# Patient Record
Sex: Female | Born: 1950 | Race: White | Hispanic: No | Marital: Married | Smoking: Never smoker
Health system: Southern US, Community
[De-identification: ages and names within clinical notes are randomized; demographics above are authoritative.]

## PROBLEM LIST (undated history)

## (undated) DIAGNOSIS — J45909 Unspecified asthma, uncomplicated: Secondary | ICD-10-CM

---

## 2020-03-21 ENCOUNTER — Other Ambulatory Visit: Payer: Self-pay | Admitting: Family Medicine

## 2020-03-21 DIAGNOSIS — E2839 Other primary ovarian failure: Secondary | ICD-10-CM

## 2021-11-03 ENCOUNTER — Observation Stay: Payer: Medicare Other

## 2021-11-03 ENCOUNTER — Other Ambulatory Visit: Payer: Self-pay

## 2021-11-03 ENCOUNTER — Emergency Department: Payer: Medicare Other

## 2021-11-03 ENCOUNTER — Encounter: Payer: Self-pay | Admitting: Family Medicine

## 2021-11-03 ENCOUNTER — Observation Stay
Admission: EM | Admit: 2021-11-03 | Discharge: 2021-11-04 | Disposition: A | Discharge status: Home / Self Care | Payer: Medicare Other | Attending: Internal Medicine | Admitting: Internal Medicine

## 2021-11-03 DIAGNOSIS — E039 Hypothyroidism, unspecified: Secondary | ICD-10-CM | POA: Diagnosis not present

## 2021-11-03 DIAGNOSIS — G4733 Obstructive sleep apnea (adult) (pediatric): Secondary | ICD-10-CM

## 2021-11-03 DIAGNOSIS — Z20822 Contact with and (suspected) exposure to covid-19: Secondary | ICD-10-CM | POA: Insufficient documentation

## 2021-11-03 DIAGNOSIS — Z79899 Other long term (current) drug therapy: Secondary | ICD-10-CM | POA: Insufficient documentation

## 2021-11-03 DIAGNOSIS — Z7901 Long term (current) use of anticoagulants: Secondary | ICD-10-CM | POA: Insufficient documentation

## 2021-11-03 DIAGNOSIS — Z7984 Long term (current) use of oral hypoglycemic drugs: Secondary | ICD-10-CM | POA: Diagnosis not present

## 2021-11-03 DIAGNOSIS — Z9989 Dependence on other enabling machines and devices: Secondary | ICD-10-CM

## 2021-11-03 DIAGNOSIS — I482 Chronic atrial fibrillation, unspecified: Secondary | ICD-10-CM | POA: Diagnosis present

## 2021-11-03 DIAGNOSIS — J45909 Unspecified asthma, uncomplicated: Secondary | ICD-10-CM | POA: Diagnosis not present

## 2021-11-03 DIAGNOSIS — E119 Type 2 diabetes mellitus without complications: Secondary | ICD-10-CM | POA: Diagnosis not present

## 2021-11-03 DIAGNOSIS — R29810 Facial weakness: Principal | ICD-10-CM | POA: Diagnosis present

## 2021-11-03 DIAGNOSIS — I10 Essential (primary) hypertension: Secondary | ICD-10-CM | POA: Diagnosis not present

## 2021-11-03 DIAGNOSIS — J4540 Moderate persistent asthma, uncomplicated: Secondary | ICD-10-CM

## 2021-11-03 DIAGNOSIS — R918 Other nonspecific abnormal finding of lung field: Secondary | ICD-10-CM | POA: Diagnosis present

## 2021-11-03 DIAGNOSIS — G510 Bell's palsy: Secondary | ICD-10-CM

## 2021-11-03 HISTORY — DX: Obstructive sleep apnea (adult) (pediatric): G47.33

## 2021-11-03 HISTORY — DX: Hypothyroidism, unspecified: E03.9

## 2021-11-03 HISTORY — DX: Unspecified asthma, uncomplicated: J45.909

## 2021-11-03 HISTORY — DX: Chronic atrial fibrillation, unspecified: I48.20

## 2021-11-03 LAB — COMPREHENSIVE METABOLIC PANEL
ALT: 31 U/L (ref 0–44)
AST: 25 U/L (ref 15–41)
Albumin: 4 g/dL (ref 3.5–5.0)
Alkaline Phosphatase: 54 U/L (ref 38–126)
Anion gap: 8 (ref 5–15)
BUN: 15 mg/dL (ref 8–23)
CO2: 27 mmol/L (ref 22–32)
Calcium: 9.6 mg/dL (ref 8.9–10.3)
Chloride: 99 mmol/L (ref 98–111)
Creatinine, Ser: 1.02 mg/dL — ABNORMAL HIGH (ref 0.44–1.00)
GFR, Estimated: 59 mL/min — ABNORMAL LOW (ref >60)
Glucose, Bld: 142 mg/dL — ABNORMAL HIGH (ref 70–99)
Potassium: 4.4 mmol/L (ref 3.5–5.1)
Sodium: 134 mmol/L — ABNORMAL LOW (ref 135–145)
Total Bilirubin: 0.8 mg/dL (ref 0.3–1.2)
Total Protein: 7.1 g/dL (ref 6.5–8.1)

## 2021-11-03 LAB — CBC
HCT: 40.6 % (ref 36.0–46.0)
Hemoglobin: 14.1 g/dL (ref 12.0–15.0)
MCH: 31.7 pg (ref 26.0–34.0)
MCHC: 34.7 g/dL (ref 30.0–36.0)
MCV: 91.2 fL (ref 80.0–100.0)
Platelets: 198 10*3/uL (ref 150–400)
RBC: 4.45 MIL/uL (ref 3.87–5.11)
RDW: 12.7 % (ref 11.5–15.5)
WBC: 7 10*3/uL (ref 4.0–10.5)
nRBC: 0 % (ref 0.0–0.2)

## 2021-11-03 LAB — DIFFERENTIAL
Abs Immature Granulocytes: 0.05 10*3/uL (ref 0.00–0.07)
Basophils Absolute: 0.1 10*3/uL (ref 0.0–0.1)
Basophils Relative: 1 %
Eosinophils Absolute: 0.1 10*3/uL (ref 0.0–0.5)
Eosinophils Relative: 1 %
Immature Granulocytes: 1 %
Lymphocytes Relative: 32 %
Lymphs Abs: 2.3 10*3/uL (ref 0.7–4.0)
Monocytes Absolute: 0.8 10*3/uL (ref 0.1–1.0)
Monocytes Relative: 12 %
Neutro Abs: 3.7 10*3/uL (ref 1.7–7.7)
Neutrophils Relative %: 53 %

## 2021-11-03 LAB — PROTIME-INR
INR: 1.2 (ref 0.8–1.2)
Prothrombin Time: 15.5 seconds — ABNORMAL HIGH (ref 11.4–15.2)

## 2021-11-03 LAB — RESP PANEL BY RT-PCR (FLU A&B, COVID) ARPGX2
Influenza A by PCR: NEGATIVE
Influenza B by PCR: NEGATIVE
SARS Coronavirus 2 by RT PCR: NEGATIVE

## 2021-11-03 LAB — APTT: aPTT: 28 seconds (ref 24–36)

## 2021-11-03 MED ORDER — APIXABAN 5 MG PO TABS
5.0000 mg | ORAL_TABLET | Freq: Two times a day (BID) | ORAL | Status: DC
  Administered 2021-11-04: 5 mg via ORAL
  Filled 2021-11-03: qty 1

## 2021-11-03 MED ORDER — SODIUM CHLORIDE 0.9 % IV SOLN: INTRAVENOUS | Status: DC

## 2021-11-03 MED ORDER — ACETAMINOPHEN 325 MG PO TABS: 650.0000 mg | ORAL_TABLET | ORAL | Status: DC | PRN

## 2021-11-03 MED ORDER — LEVOTHYROXINE SODIUM 50 MCG PO TABS
75.0000 ug | ORAL_TABLET | Freq: Every day | ORAL | Status: DC
  Administered 2021-11-04: 75 ug via ORAL
  Filled 2021-11-03: qty 2

## 2021-11-03 MED ORDER — SPIRONOLACTONE 25 MG PO TABS
100.0000 mg | ORAL_TABLET | Freq: Every day | ORAL | Status: DC
  Administered 2021-11-04: 100 mg via ORAL
  Filled 2021-11-03: qty 4

## 2021-11-03 MED ORDER — ACETAMINOPHEN 325 MG RE SUPP: 650.0000 mg | RECTAL | Status: DC | PRN

## 2021-11-03 MED ORDER — ACETAMINOPHEN 160 MG/5ML PO SOLN
650.0000 mg | ORAL | Status: DC | PRN
  Filled 2021-11-03: qty 20.3

## 2021-11-03 MED ORDER — ALBUTEROL SULFATE (2.5 MG/3ML) 0.083% IN NEBU: 2.5000 mg | INHALATION_SOLUTION | Freq: Four times a day (QID) | RESPIRATORY_TRACT | Status: DC | PRN

## 2021-11-03 MED ORDER — ALPRAZOLAM 0.5 MG PO TABS: 0.5000 mg | ORAL_TABLET | Freq: Every day | ORAL | Status: DC | PRN

## 2021-11-03 MED ORDER — INSULIN ASPART 100 UNIT/ML IJ SOLN: 0.0000 [IU] | Freq: Three times a day (TID) | INTRAMUSCULAR | Status: DC

## 2021-11-03 MED ORDER — LORAZEPAM 2 MG/ML IJ SOLN
1.0000 mg | Freq: Once | INTRAMUSCULAR | Status: AC | PRN
  Administered 2021-11-03: 2 mg via INTRAVENOUS
  Filled 2021-11-03: qty 1

## 2021-11-03 MED ORDER — IOHEXOL 350 MG/ML SOLN
75.0000 mL | Freq: Once | INTRAVENOUS | Status: AC | PRN
  Administered 2021-11-03: 75 mL via INTRAVENOUS

## 2021-11-03 MED ORDER — MIRTAZAPINE 15 MG PO TABS: 30.0000 mg | ORAL_TABLET | Freq: Every day | ORAL | Status: DC

## 2021-11-03 MED ORDER — MONTELUKAST SODIUM 10 MG PO TABS: 10.0000 mg | ORAL_TABLET | Freq: Every day | ORAL | Status: DC

## 2021-11-03 MED ORDER — STROKE: EARLY STAGES OF RECOVERY BOOK: Freq: Once | Status: DC

## 2021-11-03 MED ORDER — PRAVASTATIN SODIUM 20 MG PO TABS: 20.0000 mg | ORAL_TABLET | Freq: Every day | ORAL | Status: DC

## 2021-11-03 MED ORDER — SODIUM CHLORIDE 0.9% FLUSH
3.0000 mL | Freq: Once | INTRAVENOUS | Status: AC
  Administered 2021-11-03: 3 mL via INTRAVENOUS

## 2021-11-03 MED ORDER — MOMETASONE FURO-FORMOTEROL FUM 100-5 MCG/ACT IN AERO
2.0000 | INHALATION_SPRAY | Freq: Two times a day (BID) | RESPIRATORY_TRACT | Status: DC
Start: 2021-11-03 — End: 2021-11-04

## 2021-11-03 MED ORDER — SENNOSIDES-DOCUSATE SODIUM 8.6-50 MG PO TABS: 1.0000 | ORAL_TABLET | Freq: Every evening | ORAL | Status: DC | PRN

## 2021-11-03 NOTE — ED Provider Notes (Signed)
? ?[facility] ?Provider Note ? ? ? Event Date/Time  ? First MD Initiated Contact with Patient 11/03/21 1842   ?  (approximate) ? ? ?History  ? ?No chief complaint on file. ? ? ?HPI ? ?Whitney Bailey is a 71 y.o. female with A-fib on Eliquis who comes in with sudden onset of left facial droop.  Patient reports a little bit of watering in her right eye earlier today but reports waking up and feeling her normal self and then around 530-545 family noted that she had some droop on her left side.  Husband was with her earlier around 23 does not remember seeing anything.  She reports having normal speech no weakness in her arms or legs.  She is able to wrinkle her bilateral forehead.  Denies any issues with her speech.  Stroke code called from EMS.  Patient does report being compliant with her Eliquis with last dose this morning ? ?Physical Exam  ? ?Triage Vital Signs: ?ED Triage Vitals  ?Blood pressure (!) 150/73, pulse (!) 54, temperature 98.4 ?F (36.9 ?C), resp. rate 18, weight 78 kg, SpO2 99 %. ? ?Most recent vital signs: ?Vitals:  ? 11/03/21 1930 11/03/21 2000  ?BP: (!) 163/72 (!) 150/73  ?Pulse: (!) 59 (!) 54  ?Resp: 12 18  ?Temp:    ?SpO2: 100% 99%  ? ? ? ?General: Awake, no distress.  ?CV:  Good peripheral perfusion.  ?Resp:  Normal effort.  ?Abd:  No distention.  ?Other:  NIHSS 2 -is stable to wrinkle the left forehead but has facial droop on the left side.  Otherwise neuro exam is normal with equal strength in arms and legs. ? ? ?ED Results / Procedures / Treatments  ? ?Labs ?(all labs ordered are listed, but only abnormal results are displayed) ?Labs Reviewed  ?PROTIME-INR  ?APTT  ?CBC  ?DIFFERENTIAL  ?COMPREHENSIVE METABOLIC PANEL  ?CBG MONITORING, ED  ? ? ? ?EKG ? ?My interpretation of EKG: ? ?Looks like atrial flutter with a rate of 64 without any ST elevation or T wave inversions. ? ?RADIOLOGY ?I have reviewed the CT head negative ? ?PROCEDURES: ? ?Critical Care performed: No ? ?.1-3  Lead EKG Interpretation ?Performed by: Concha Se, MD ?Authorized by: Concha Se, MD  ? ?  Interpretation: abnormal   ?  ECG rate:  60 ?  ECG rate assessment: normal   ?  Rhythm: atrial flutter   ?  Ectopy: none   ?  Conduction: normal   ? ? ?MEDICATIONS ORDERED IN ED: ?Medications  ?sodium chloride flush (NS) 0.9 % injection 3 mL (has no administration in time range)  ? ? ? ?IMPRESSION / MDM / ASSESSMENT AND PLAN / ED COURSE  ?I reviewed the triage vital signs and the nursing notes. ? ?Patient with known atrial fibrillation who comes in with concerns for left-sided facial droop.  This concerning for hemorrhagic stroke versus ischemic stroke versus Bell's palsy.  Stroke code called with EMS ? ?Discussed the case with Dr. Selina Cooley who canceled the code stroke given patient is not a tPA candidate and no evidence of LVO with only some left-sided facial droop.  Given patient's age she recommends admission to the hospital for full stroke work-up. ? ?CMP shows slightly low sodium ?CBC normal ?INR normal ? ?The patient is on the cardiac monitor to evaluate for evidence of arrhythmia and/or significant heart rate changes. ? ?FINAL CLINICAL IMPRESSION(S) / ED DIAGNOSES  ? ?Final diagnoses:  ?Facial droop  ? ? ? ?  Rx / DC Orders  ? ?ED Discharge Orders   ? ? None  ? ?  ? ? ? ?Note:  This document was prepared using Dragon voice recognition software and may include unintentional dictation errors. ?  ?Concha Se, MD ?11/03/21 2017 ? ?

## 2021-11-03 NOTE — H&P (Signed)
History and Physical    Whitney Bailey NID:[id number] DOB: 16-Jun-1951 DOA: 11/03/2021  PCP: Ethelda Chick, MD   Patient coming from: Home   Chief Complaint: Left facial weakness   HPI: Whitney Bailey is a pleasant 71 y.o. female with medical history significant for asthma, atrial fibrillation on Eliquis, OSA on CPAP, hypertension, type 2 diabetes mellitus, and hypothyroidism, now presenting to the emergency department with left facial weakness.  The patient reports that she was experiencing some soreness near the angle of her left mandible last night but had otherwise been in her usual state of health until shortly after 5 PM this evening when family noted that her left face did not appear to be moving.  She had been speaking with a family member who had not noticed this a few minutes earlier.  She denies any headache, change in vision or hearing, or any other weakness or numbness.  She does not miss any doses of Eliquis.  ED Course: Upon arrival to the ED, patient is found to be afebrile and saturating well on room air with stable blood pressure.  EKG features atrial fibrillation and head CT negative for acute intracranial abnormality.  Chemistry panel with sodium 134 and creatinine 1.02.  Neurology was consulted by the ED physician.  Review of Systems:  All other systems reviewed and apart from HPI, are negative.  Past Medical History:  Diagnosis Date   Asthma    Atrial fibrillation, chronic (HCC) 11/03/2021   Hypothyroidism 11/03/2021   OSA on CPAP 11/03/2021    History reviewed. No pertinent surgical history.  Social History:   reports that she has never smoked. She has never used smokeless tobacco. She reports that she does not currently use alcohol. No history on file for drug use.  Allergies  Allergen Reactions   Hydrochlorothiazide Other (See Comments)    Hyponatremia    History reviewed. No pertinent family history.   Prior to Admission medications   Medication Sig  Start Date End Date Taking? Authorizing Provider  ADVAIR HFA [id number] MCG/ACT inhaler Inhale 2 puffs into the lungs every 12 (twelve) hours. 09/30/21  Yes [provider]  albuterol (VENTOLIN HFA) 108 (90 Base) MCG/ACT inhaler Inhale 2 puffs into the lungs every 6 (six) hours as needed for wheezing or shortness of breath. 09/30/21  Yes [provider]  Cholecalciferol 50 MCG (2000 UT) CAPS Take 2,000 Units by mouth in the morning.   Yes [provider]  cyanocobalamin 1000 MCG tablet Take 1,000 mcg by mouth in the morning.   Yes [provider]  ELIQUIS 5 MG TABS tablet Take 5 mg by mouth 2 (two) times daily. 09/28/21  Yes [provider]  metFORMIN (GLUCOPHAGE-XR) 500 MG 24 hr tablet Take 500 mg by mouth 2 (two) times daily. 09/28/21  Yes [provider]  mirtazapine (REMERON) 30 MG tablet Take 30 mg by mouth at bedtime. 10/19/21  Yes [provider]  mometasone (NASONEX) 50 MCG/ACT nasal spray Place 1 spray into the nose 2 (two) times daily. 09/01/21  Yes [provider]  montelukast (SINGULAIR) 10 MG tablet Take 10 mg by mouth at bedtime. 10/25/21  Yes [provider]  olmesartan (BENICAR) 40 MG tablet Take 40 mg by mouth daily. 09/25/21  Yes [provider]  pravastatin (PRAVACHOL) 20 MG tablet Take 20 mg by mouth at bedtime. 10/25/21  Yes [provider]  spironolactone (ALDACTONE) 100 MG tablet Take 100 mg by mouth daily. 08/18/21  Yes [provider]  SYNTHROID 75 MCG tablet Take 75 mcg by mouth daily. 10/04/21  Yes [provider]  ALPRAZolam Prudy Feeler) 0.5 MG tablet Take 0.5 mg by mouth daily as needed for anxiety. 09/27/21   [provider]  scopolamine (TRANSDERM-SCOP) 1 MG/3DAYS Place 1 patch onto the skin every 3 (three) days. 10/23/21   [provider]    Physical Exam: Vitals:   11/03/21 1900 11/03/21 1907 11/03/21 1930 11/03/21 2000  BP: (!) 170/77  (!) 163/72 (!) 150/73   Pulse: 62  (!) 59 (!) 54  Resp: Temp: 98.4 F (36.9 C)     SpO2: 100%  100% 99%  Weight:  78 kg      Constitutional: NAD, calm  Eyes: PERTLA, lids and conjunctivae normal ENMT: Mucous membranes are moist. Posterior pharynx clear of any exudate or lesions.   Neck: supple, no masses  Respiratory:  no wheezing, no crackles. No accessory muscle use.  Cardiovascular: S1 & S2 heard, regular rate and rhythm. No extremity edema.   Abdomen: No distension, no tenderness, soft. Bowel sounds active.  Musculoskeletal: no clubbing / cyanosis. No joint deformity upper and lower extremities.   Skin: no significant rashes, lesions, ulcers. Warm, dry, well-perfused. Neurologic: Left face weakness, CN 2-12 grossly intact otherwise. Sensation to light touch intact. Strength 5/5 in all 4 limbs. Alert and oriented.  Psychiatric: Very pleasant. Cooperative.    Labs and Imaging on Admission: I have personally reviewed following labs and imaging studies  CBC: Recent Labs  Lab 11/03/21 1918  WBC 7.0  NEUTROABS 3.7  HGB 14.1  HCT 40.6  MCV 91.2  PLT 198   Basic Metabolic Panel: Recent Labs  Lab 11/03/21 1918  NA 134*  K 4.4  CL 99  CO2 27  GLUCOSE 142*  BUN 15  CREATININE 1.02*  CALCIUM 9.6   GFR: CrCl cannot be calculated (Unknown ideal weight.). Liver Function Tests: Recent Labs  Lab 11/03/21 1918  AST 25  ALT 31  ALKPHOS 54  BILITOT 0.8  PROT 7.1  ALBUMIN 4.0   No results for input(s): LIPASE, AMYLASE in the last 168 hours. No results for input(s): AMMONIA in the last 168 hours. Coagulation Profile: Recent Labs  Lab 11/03/21 1918  INR 1.2   Cardiac Enzymes: No results for input(s): CKTOTAL, CKMB, CKMBINDEX, TROPONINI in the last 168 hours. BNP (last 3 results) No results for input(s): PROBNP in the last 8760 hours. HbA1C: No results for input(s): HGBA1C in the last 72 hours. CBG: No results for input(s): GLUCAP in the last 168 hours. Lipid Profile: No  results for input(s): CHOL, HDL, LDLCALC, TRIG, CHOLHDL, LDLDIRECT in the last 72 hours. Thyroid Function Tests: No results for input(s): TSH, T4TOTAL, FREET4, T3FREE, THYROIDAB in the last 72 hours. Anemia Panel: No results for input(s): VITAMINB12, FOLATE, FERRITIN, TIBC, IRON, RETICCTPCT in the last 72 hours. Urine analysis: No results found for: COLORURINE, APPEARANCEUR, LABSPEC, PHURINE, GLUCOSEU, HGBUR, BILIRUBINUR, KETONESUR, PROTEINUR, UROBILINOGEN, NITRITE, LEUKOCYTESUR Sepsis Labs: (procalcitonin:4,lacticidven:4) ) Recent Results (from the past 240 hour(s))  Resp Panel by RT-PCR (Flu A&B, Covid) Nasopharyngeal Swab     Status: None   Collection Time: 11/03/21  9:10 PM   Specimen: Nasopharyngeal Swab; Nasopharyngeal(NP) swabs in vial transport medium  Result Value Ref Range Status   SARS Coronavirus 2 by RT PCR NEGATIVE NEGATIVE Final    Comment: (NOTE) SARS-CoV-2 target nucleic acids are NOT DETECTED.  The SARS-CoV-2 RNA is generally detectable in upper respiratory specimens  during the acute phase of infection. The lowest concentration of SARS-CoV-2 viral copies this assay can detect is 138 copies/mL. A negative result does not preclude SARS-Cov-2 infection and should not be used as the sole basis for treatment or other patient management decisions. A negative result may occur with  improper specimen collection/handling, submission of specimen other than nasopharyngeal swab, presence of viral mutation(s) within the areas targeted by this assay, and inadequate number of viral copies(<138 copies/mL). A negative result must be combined with clinical observations, patient history, and epidemiological information. The expected result is Negative.  Fact Sheet for Patients:  BloggerCourse.com  Fact Sheet for Healthcare Providers:  SeriousBroker.it  This test is no t yet approved or cleared by the [place] FDA and   has been authorized for detection and/or diagnosis of SARS-CoV-2 by FDA under an Emergency Use Authorization (EUA). This EUA will remain  in effect (meaning this test can be used) for the duration of the COVID-19 declaration under Section 564(b)(1) of the Act, 21 U.S.C.section 360bbb-3(b)(1), unless the authorization is terminated  or revoked sooner.       Influenza A by PCR NEGATIVE NEGATIVE Final   Influenza B by PCR NEGATIVE NEGATIVE Final    Comment: (NOTE) The Xpert Xpress SARS-CoV-2/FLU/RSV plus assay is intended as an aid in the diagnosis of influenza from Nasopharyngeal swab specimens and should not be used as a sole basis for treatment. Nasal washings and aspirates are unacceptable for Xpert Xpress SARS-CoV-2/FLU/RSV testing.  Fact Sheet for Patients: BloggerCourse.com  Fact Sheet for Healthcare Providers: SeriousBroker.it  This test is not yet approved or cleared by the [place] FDA and has been authorized for detection and/or diagnosis of SARS-CoV-2 by FDA under an Emergency Use Authorization (EUA). This EUA will remain in effect (meaning this test can be used) for the duration of the COVID-19 declaration under Section 564(b)(1) of the Act, 21 U.S.C. section 360bbb-3(b)(1), unless the authorization is terminated or revoked.  Performed at [facility], [address], [place], [place] [address]      Radiological Exams on Admission: CT HEAD CODE STROKE WO CONTRAST  Result Date: 11/03/2021 CLINICAL DATA:  Code stroke.  Acute neurologic deficit EXAM: CT HEAD WITHOUT CONTRAST TECHNIQUE: Contiguous axial images were obtained from the base of the skull through the vertex without intravenous contrast. RADIATION DOSE REDUCTION: This exam was performed according to the departmental dose-optimization program which includes automated exposure control, adjustment of the mA and/or kV according to patient size  and/or use of iterative reconstruction technique. COMPARISON:  None. FINDINGS: Brain: There is no mass, hemorrhage or extra-axial collection. The size and configuration of the ventricles and extra-axial CSF spaces are normal. The brain parenchyma is normal, without evidence of acute or chronic infarction. Vascular: No abnormal hyperdensity of the major intracranial arteries or dural venous sinuses. No intracranial atherosclerosis. Skull: The visualized skull base, calvarium and extracranial soft tissues are normal. Sinuses/Orbits: No fluid levels or advanced mucosal thickening of the visualized paranasal sinuses. No mastoid or middle ear effusion. The orbits are normal. ASPECTS [facility] Stroke Program Early CT Score) - Ganglionic level infarction (caudate, lentiform nuclei, internal capsule, insula, M1-M3 cortex): 7 - Supraganglionic infarction (M4-M6 cortex): 3 Total score (0-10 with 10 being normal): 10 IMPRESSION: 1. No acute intracranial abnormality. 2. ASPECTS is 10. These results were called by telephone at the time of interpretation on 11/03/2021 at 6:51 pm to provider [facility] , who verbally acknowledged these results. Electronically Signed   By: Caryn Bee  Chase PicketHerman M.D.   On: 11/03/2021 18:52    EKG: Independently reviewed. Atrial fibrillation.   Assessment/Plan   1. Left facial weakness  - Presents with acute-onset left facial weakness  - No acute findings on head CT  - Passed swallow screen in ED  - Appreciate neurology consultation, plan to continue neuro checks, continue cardiac monitoring, check MRI brain, CTA head and neck, A1c and lipids, and consult PT/OT/SLP    2. Atrial fibrillation  - Continue Eliquis   3. Asthma  - Stable, not in exacerbation  - Continue ICS/LABA, Singulair, and as-needed SABA    4. Hypertension  - Permit HTN pending MRI brain    5. Type II DM  - Check A1c, check CBGs, use low-intensity SSI for now    DVT prophylaxis: Eliquis  Code Status: Full  Level of  Care: Level of care: Telemetry Medical Family Communication: husband at bedside  Disposition Plan:  Patient is from: home  Anticipated d/c is to: Home Anticipated d/c date is: 3/14 or 11/05/21  Patient currently: Pending CVA/TIA workup  Consults called: neurology  Admission status: Observation     Briscoe Deutscherimothy S Alyia Lacerte, MD [facility]  11/03/2021, 10:18 PM

## 2021-11-03 NOTE — ED Triage Notes (Signed)
Pt arrived via EMS. Pt LKW is 1745. Per pt and pt husband pt was fine all day long and than family came over around 1740 and observed pt left side of her mouth not moving while talking and also that pt left eye was not blinking. Pt called 911 herself. ?

## 2021-11-03 NOTE — ED Notes (Signed)
Patient transported to MRI via stretcher with MRI tech. ?

## 2021-11-03 NOTE — ED Notes (Signed)
PER STACK, MD (NEURO) CODE STROKE CANCELLED . NOTIFIED TAMMY AT [facility]  ?

## 2021-11-03 NOTE — Progress Notes (Signed)
Neurology telephone note ? ?Stroke code was called in the field for new onset facial droop LKW 1745 today. Patient has hx a fib on eliquis, last dose today. Per Dr. Fuller Plan, patient has no other neurologic deficits. I personally reviewed her head CT which was normal and specifically showed no hemorrhage. Patient is not a TNK candidate 2/2 eliquis, and given isolated facial droop without other neurologic sx TNK would not be warranted regardless. Exam not c/w LVO therefore intervention would not be considered. As such, there is no indication for stroke code. Patient will be admitted obs for stroke/TIA workup and I will see her in AM. ? ?Interim recommendations: ? ?- Admit to hospitalist service for stroke w/u; neuro will see her tmrw ?- Permissive HTN x48 hrs from sx onset or until stroke ruled out by MRI goal BP <220/110. PRN labetalol or hydralazine if BP above these parameters. Avoid oral antihypertensives. ?- MRI brain wo contrast ?- CTA or MRA H&N  ?- TTE w/ bubble ?- Check A1c and LDL + add statin per guidelines ?- Continue eliquis ?- q4 hr neuro checks ?- STAT head CT for any change in neuro exam ?- Tele ?- PT/OT/SLP ?- Stroke education ?- Amb referral to neurology upon discharge  ? ?I will see patient in AM. ? ?Bing Neighbors, MD ?[facility] ?[phone] ? ?If 7pm- 7am, please page neurology on call as listed in AMION. ? ?

## 2021-11-04 ENCOUNTER — Observation Stay: Admit: 2021-11-04 | Payer: Medicare Other

## 2021-11-04 DIAGNOSIS — R29810 Facial weakness: Secondary | ICD-10-CM | POA: Diagnosis not present

## 2021-11-04 DIAGNOSIS — G510 Bell's palsy: Secondary | ICD-10-CM | POA: Diagnosis not present

## 2021-11-04 DIAGNOSIS — R918 Other nonspecific abnormal finding of lung field: Secondary | ICD-10-CM | POA: Diagnosis present

## 2021-11-04 LAB — COMPREHENSIVE METABOLIC PANEL
ALT: 26 U/L (ref 0–44)
AST: 22 U/L (ref 15–41)
Albumin: 3.7 g/dL (ref 3.5–5.0)
Alkaline Phosphatase: 49 U/L (ref 38–126)
Anion gap: 6 (ref 5–15)
BUN: 12 mg/dL (ref 8–23)
CO2: 26 mmol/L (ref 22–32)
Calcium: 8.9 mg/dL (ref 8.9–10.3)
Chloride: 102 mmol/L (ref 98–111)
Creatinine, Ser: 0.73 mg/dL (ref 0.44–1.00)
GFR, Estimated: >60 mL/min (ref >60)
Glucose, Bld: 114 mg/dL — ABNORMAL HIGH (ref 70–99)
Potassium: 4 mmol/L (ref 3.5–5.1)
Sodium: 134 mmol/L — ABNORMAL LOW (ref 135–145)
Total Bilirubin: 1 mg/dL (ref 0.3–1.2)
Total Protein: 6.5 g/dL (ref 6.5–8.1)

## 2021-11-04 LAB — LIPID PANEL
Cholesterol: 104 mg/dL (ref 0–200)
HDL: 44 mg/dL (ref >40)
LDL Cholesterol: 47 mg/dL (ref 0–99)
Total CHOL/HDL Ratio: 2.4 RATIO
Triglycerides: 66 mg/dL (ref <150)
VLDL: 13 mg/dL (ref 0–40)

## 2021-11-04 LAB — CBC
HCT: 41.7 % (ref 36.0–46.0)
Hemoglobin: 14.4 g/dL (ref 12.0–15.0)
MCH: 31.9 pg (ref 26.0–34.0)
MCHC: 34.5 g/dL (ref 30.0–36.0)
MCV: 92.5 fL (ref 80.0–100.0)
Platelets: 199 10*3/uL (ref 150–400)
RBC: 4.51 MIL/uL (ref 3.87–5.11)
RDW: 12.9 % (ref 11.5–15.5)
WBC: 7.3 10*3/uL (ref 4.0–10.5)
nRBC: 0 % (ref 0.0–0.2)

## 2021-11-04 LAB — GLUCOSE, CAPILLARY: Glucose-Capillary: 182 mg/dL — ABNORMAL HIGH (ref 70–99)

## 2021-11-04 LAB — CBG MONITORING, ED: Glucose-Capillary: 105 mg/dL — ABNORMAL HIGH (ref 70–99)

## 2021-11-04 MED ORDER — VALACYCLOVIR HCL 1 G PO TABS: 1000.0000 mg | ORAL_TABLET | Freq: Three times a day (TID) | ORAL | 0 refills | Status: AC

## 2021-11-04 MED ORDER — VALACYCLOVIR HCL 500 MG PO TABS
1000.0000 mg | ORAL_TABLET | Freq: Three times a day (TID) | ORAL | Status: DC
  Administered 2021-11-04: 1000 mg via ORAL
  Filled 2021-11-04: qty 2

## 2021-11-04 MED ORDER — PREDNISONE 20 MG PO TABS
60.0000 mg | ORAL_TABLET | Freq: Every day | ORAL | Status: DC
  Administered 2021-11-04: 60 mg via ORAL
  Filled 2021-11-04: qty 3

## 2021-11-04 MED ORDER — MOMETASONE FURO-FORMOTEROL FUM 100-5 MCG/ACT IN AERO
2.0000 | INHALATION_SPRAY | Freq: Two times a day (BID) | RESPIRATORY_TRACT | Status: DC
  Filled 2021-11-04: qty 8.8

## 2021-11-04 MED ORDER — PREDNISONE 20 MG PO TABS: 60.0000 mg | ORAL_TABLET | Freq: Every day | ORAL | 0 refills | Status: AC

## 2021-11-04 NOTE — Evaluation (Signed)
Physical Therapy Evaluation ?Patient Details ?Name: Whitney Bailey ?MRN: [id number] ?DOB: 06-01-1951 ?Today's Date: 11/04/2021 ? ?History of Present Illness ? Pt is a 71 y.o. female with medical history significant for asthma, atrial fibrillation on Eliquis, OSA on CPAP, hypertension, type 2 diabetes mellitus, and hypothyroidism, now presenting to the emergency department with left facial weakness, no acute findings on head CT or MRI. ?  ?Clinical Impression ? Pt was pleasant and motivated to participate during the session and put forth good effort throughout. Pt independent with all functional tasks per below with both pt and spouse reporting that pt is at baseline with all mobility.  No deficits in sensation, coordination, strength, or proprioception noted.  Pt did ambulate with a somewhat reduced cadence but was reported as baseline for patient.  No skilled PT needs identified.  Will complete PT orders at this time but will reassess pt pending a change in status upon receipt of new PT orders. ? ?   ?   ? ?Recommendations for follow up therapy are one component of a multi-disciplinary discharge planning process, led by the attending physician.  Recommendations may be updated based on patient status, additional functional criteria and insurance authorization. ? ?Follow Up Recommendations No PT follow up ? ?  ?Assistance Recommended at Discharge None  ?Patient can return home with the following ? Assist for transportation ? ?  ?Equipment Recommendations None recommended by PT  ?Recommendations for Other Services ?    ?  ?Functional Status Assessment Patient has not had a recent decline in their functional status  ? ?  ?Precautions / Restrictions Precautions ?Precautions: None ?Restrictions ?Weight Bearing Restrictions: No  ? ?  ? ?Mobility ? Bed Mobility ?Overal bed mobility: Independent ?  ?  ?  ?  ?  ?  ?General bed mobility comments: Good speed and effort ?  ? ?Transfers ?Overall transfer level: Independent ?  ?  ?   ?  ?  ?  ?  ?  ?General transfer comment: Good eccentric and concentric control and stability ?  ? ?Ambulation/Gait ?Ambulation/Gait assistance: Independent ?Gait Distance (Feet): 150 Feet ?Assistive device: None ?Gait Pattern/deviations: Step-through pattern, Decreased step length - right, Decreased step length - left ?Gait velocity: decreased ?  ?  ?General Gait Details: Cadence minimally decreased but baseline per both pt and spouse; steady gait with no buckling or LOB including during dynamic gait with head turns and start/stops ? ?Stairs ?  ?  ?  ?  ?  ? ?Wheelchair Mobility ?  ? ?Modified Rankin (Stroke Patients Only) ?  ? ?  ? ?Balance Overall balance assessment: Independent ?  ?  ?  ?  ?  ?  ?  ?  ?  ?  ?  ?  ?  ?  ?  ?  ?  ?  ?   ? ? ? ?Pertinent Vitals/Pain Pain Assessment ?Pain Assessment: No/denies pain  ? ? ?Home Living Family/patient expects to be discharged to:: Private residence ?Living Arrangements: Spouse/significant other ?Available Help at Discharge: Family;Available 24 hours/day ?Type of Home: House ?Home Access: Stairs to enter ?Entrance Stairs-Rails: Right;Left (too wide for both) ?Entrance Stairs-Number of Steps: 4 ?Alternate Level Stairs-Number of Steps: 13 ?Home Layout: Two level;Able to live on main level with bedroom/bathroom ?Home Equipment: Crutches ?   ?  ?Prior Function Prior Level of Function : Independent/Modified Independent ?  ?  ?  ?  ?  ?  ?Mobility Comments: Ind amb community distances without an AD, no  fall history ?ADLs Comments: Ind with ADLs ?  ? ? ?Hand Dominance  ? Dominant Hand: Right ? ?  ?Extremity/Trunk Assessment  ? Upper Extremity Assessment ?Upper Extremity Assessment: Overall WFL for tasks assessed;RUE deficits/detail;LUE deficits/detail ?RUE Deficits / Details: BUE strength, sensation to light touch, coordination, and proprioception all WNL ?RUE Sensation: WNL ?RUE Coordination: WNL ?LUE Deficits / Details: BUE strength, sensation to light touch, coordination,  and proprioception all WNL ?LUE Sensation: WNL ?LUE Coordination: WNL ?  ? ?Lower Extremity Assessment ?Lower Extremity Assessment: Overall WFL for tasks assessed;RLE deficits/detail;LLE deficits/detail ?RLE Deficits / Details: BLE strength, sensation to light touch, coordination, and proprioception all WNL ?RLE Sensation: WNL ?RLE Coordination: WNL ?LLE Deficits / Details: BLE strength, sensation to light touch, coordination, and proprioception all WNL ?LLE Sensation: WNL ?LLE Coordination: WNL ?  ? ?   ?Communication  ? Communication: No difficulties  ?Cognition Arousal/Alertness: Awake/alert ?Behavior During Therapy: [facility] for tasks assessed/performed ?Overall Cognitive Status: Within Functional Limits for tasks assessed ?  ?  ?  ?  ?  ?  ?  ?  ?  ?  ?  ?  ?  ?  ?  ?  ?  ?  ?  ? ?  ?General Comments General comments (skin integrity, edema, etc.): Pt steady with feet together and in semi-tandem with eyes closed ? ?  ?Exercises    ? ?Assessment/Plan  ?  ?PT Assessment Patient does not need any further PT services  ?PT Problem List   ? ?   ?  ?PT Treatment Interventions     ? ?PT Goals (Current goals can be found in the Care Plan section)  ?Acute Rehab PT Goals ?PT Goal Formulation: All assessment and education complete, DC therapy ? ?  ?Frequency   ?  ? ? ?Co-evaluation   ?  ?  ?  ?  ? ? ?  ?AM-PAC PT "6 Clicks" Mobility  ?Outcome Measure Help needed turning from your back to your side while in a flat bed without using bedrails?: None ?Help needed moving from lying on your back to sitting on the side of a flat bed without using bedrails?: None ?Help needed moving to and from a bed to a chair (including a wheelchair)?: None ?Help needed standing up from a chair using your arms (e.g., wheelchair or bedside chair)?: None ?Help needed to walk in hospital room?: None ?Help needed climbing 3-5 steps with a railing? : None ?6 Click Score: 24 ? ?  ?End of Session Equipment Utilized During Treatment: Gait belt ?Activity  Tolerance: Patient tolerated treatment well ?Patient left: Other (comment);with family/visitor present;with nursing/sitter in room (Pt left on [facility] with nursing present) ?Nurse Communication: Mobility status ?PT Visit Diagnosis: Muscle weakness (generalized) (M62.81) ?  ? ?Time: 3419-3790 ?PT Time Calculation (min) (ACUTE ONLY): 23 min ? ? ?Charges:   PT Evaluation ?$PT Eval Low Complexity: 1 Low ?  ?  ?   ? ?D. Elly Modena PT, DPT ?11/04/21, 9:42 AM ? ? ?

## 2021-11-04 NOTE — Consult Note (Signed)
?NEUROLOGY CONSULTATION NOTE  ? ?Date of service: November 04, 2021 ?Patient Name: Whitney Bailey ?MRN:  [id number] ?DOB:  1950-11-02 ?Reason for consult: L facial droop ?Requesting physician: Dr. Marjean Donna ?_ _ _   _ __   _ __ _ _  __ __   _ __   __ _ ? ?History of Present Illness  ? ?This is a 71 year old woman with past medical history significant for atrial fibrillation on Eliquis, hypothyroidism, OSA on CPAP, asthma who presented yesterday evening after sudden onset of upper and lower drooping of her left face.  She had no other neurologic deficits at that time and has no other neurologic deficits now on exam.  She has not had any recent illness.  MRI brain did not show evidence of acute infarct.  She is not having any pain in her ear or ear although she does complain of swelling and tenderness to palpation in her left greater than right superior cervical lymph nodes.  No changes in taste. Eye closure is weak but complete on L side. No other complaints. MRI brain wo contrast showed no e/o acute infarct (personal review). CTA H&N showed no hemodynamically significant stenosis. Radiology did note incidental finding of multiple pulmonary nodules, which patient states have been evaluated over the past 10 yrs most recently with PET scan last year and she has been told they are benign. ?  ?ROS  ? ?Per HPI: all other systems reviewed and are negative ? ?Past History  ? ?I have reviewed the following: ? ?Past Medical History:  ?Diagnosis Date  ? Asthma   ? Atrial fibrillation, chronic ([place]) 11/03/2021  ? Hypothyroidism 11/03/2021  ? OSA on CPAP 11/03/2021  ? ?History reviewed. No pertinent surgical history. ?History reviewed. No pertinent family history. ?Social History  ? ?Socioeconomic History  ? Marital status: Married  ?  Spouse name: Not on file  ? Number of children: Not on file  ? Years of education: Not on file  ? Highest education level: Not on file  ?Occupational History  ? Not on file  ?Tobacco Use  ? Smoking  status: Never  ? Smokeless tobacco: Never  ?Substance and Sexual Activity  ? Alcohol use: Not Currently  ? Drug use: Not on file  ? Sexual activity: Not on file  ?Other Topics Concern  ? Not on file  ?Social History Narrative  ? Not on file  ? ?Social Determinants of Health  ? ?Financial Resource Strain: Not on file  ?Food Insecurity: Not on file  ?Transportation Needs: Not on file  ?Physical Activity: Not on file  ?Stress: Not on file  ?Social Connections: Not on file  ? ?Allergies  ?Allergen Reactions  ? Hydrochlorothiazide Other (See Comments)  ?  Hyponatremia  ? ? ?Medications  ? ?(Not in a hospital admission) ?  ? ? ?Current Facility-Administered Medications:  ?   stroke: mapping our early stages of recovery book, , Does not apply, Once, Opyd, Ilene Qua, MD ?  acetaminophen (TYLENOL) tablet 650 mg, 650 mg, Oral, Q4H PRN **OR** acetaminophen (TYLENOL) 160 MG/5ML solution 650 mg, 650 mg, Per Tube, Q4H PRN **OR** acetaminophen (TYLENOL) suppository 650 mg, 650 mg, Rectal, Q4H PRN, Opyd, Timothy S, MD ?  albuterol (PROVENTIL) (2.5 MG/3ML) 0.083% nebulizer solution 2.5 mg, 2.5 mg, Inhalation, Q6H PRN, Opyd, Ilene Qua, MD ?  ALPRAZolam Duanne Moron) tablet 0.5 mg, 0.5 mg, Oral, Daily PRN, Opyd, Ilene Qua, MD ?  apixaban (ELIQUIS) tablet 5 mg, 5 mg, Oral, BID, Opyd, Christia Reading  S, MD, 5 mg at 11/04/21 0905 ?  insulin aspart (novoLOG) injection 0-6 Units, 0-6 Units, Subcutaneous, TID WC, Opyd, Ilene Qua, MD ?  levothyroxine (SYNTHROID) tablet 75 mcg, 75 mcg, Oral, Q0600, Opyd, Ilene Qua, MD, 75 mcg at 11/04/21 0615 ?  mirtazapine (REMERON) tablet 30 mg, 30 mg, Oral, QHS, Opyd, Ilene Qua, MD ?  mometasone-formoterol (DULERA) 100-5 MCG/ACT inhaler 2 puff, 2 puff, Inhalation, BID, Dorothe Pea, RPH ?  montelukast (SINGULAIR) tablet 10 mg, 10 mg, Oral, QHS, Opyd, Timothy S, MD ?  pravastatin (PRAVACHOL) tablet 20 mg, 20 mg, Oral, QHS, Opyd, Timothy S, MD ?  predniSONE (DELTASONE) tablet 60 mg, 60 mg, Oral, Q breakfast, Derek Jack, MD, 60 mg at 11/04/21 1123 ?  senna-docusate (Senokot-S) tablet 1 tablet, 1 tablet, Oral, QHS PRN, Opyd, Ilene Qua, MD ?  spironolactone (ALDACTONE) tablet 100 mg, 100 mg, Oral, Daily, Opyd, Ilene Qua, MD, 100 mg at 11/04/21 1124 ?  valACYclovir (VALTREX) tablet 1,000 mg, 1,000 mg, Oral, TID, Derek Jack, MD, 1,000 mg at 11/04/21 1124 ? ?Current Outpatient Medications:  ?  ADVAIR HFA 45-21 MCG/ACT inhaler, Inhale 2 puffs into the lungs every 12 (twelve) hours., Disp: , Rfl:  ?  albuterol (VENTOLIN HFA) 108 (90 Base) MCG/ACT inhaler, Inhale 2 puffs into the lungs every 6 (six) hours as needed for wheezing or shortness of breath., Disp: , Rfl:  ?  Cholecalciferol 50 MCG (2000 UT) CAPS, Take 2,000 Units by mouth in the morning., Disp: , Rfl:  ?  cyanocobalamin 1000 MCG tablet, Take 1,000 mcg by mouth in the morning., Disp: , Rfl:  ?  ELIQUIS 5 MG TABS tablet, Take 5 mg by mouth 2 (two) times daily., Disp: , Rfl:  ?  metFORMIN (GLUCOPHAGE-XR) 500 MG 24 hr tablet, Take 500 mg by mouth 2 (two) times daily., Disp: , Rfl:  ?  mirtazapine (REMERON) 30 MG tablet, Take 30 mg by mouth at bedtime., Disp: , Rfl:  ?  mometasone (NASONEX) 50 MCG/ACT nasal spray, Place 1 spray into the nose 2 (two) times daily., Disp: , Rfl:  ?  montelukast (SINGULAIR) 10 MG tablet, Take 10 mg by mouth at bedtime., Disp: , Rfl:  ?  olmesartan (BENICAR) 40 MG tablet, Take 40 mg by mouth daily., Disp: , Rfl:  ?  pravastatin (PRAVACHOL) 20 MG tablet, Take 20 mg by mouth at bedtime., Disp: , Rfl:  ?  spironolactone (ALDACTONE) 100 MG tablet, Take 100 mg by mouth daily., Disp: , Rfl:  ?  SYNTHROID 75 MCG tablet, Take 75 mcg by mouth daily., Disp: , Rfl:  ?  ALPRAZolam (XANAX) 0.5 MG tablet, Take 0.5 mg by mouth daily as needed for anxiety., Disp: , Rfl:  ?  [START ON 11/05/2021] predniSONE (DELTASONE) 20 MG tablet, Take 3 tablets (60 mg total) by mouth daily with breakfast for 6 days., Disp: 18 tablet, Rfl: 0 ?  scopolamine  (TRANSDERM-SCOP) 1 MG/3DAYS, Place 1 patch onto the skin every 3 (three) days., Disp: , Rfl:  ?  valACYclovir (VALTREX) 1000 MG tablet, Take 1 tablet (1,000 mg total) by mouth 3 (three) times daily for 7 days., Disp: 20 tablet, Rfl: 0 ? ?Vitals  ? ?Vitals:  ? 11/04/21 0430 11/04/21 0530 11/04/21 0630 11/04/21 1000  ?BP: 118/64 121/71 121/63 (!) 145/87  ?Pulse: (!) 48 (!) 52  83  ?Resp: 16 18 14 18   ?Temp:    98.1 ?F (36.7 ?C)  ?TempSrc:    Oral  ?SpO2: 95%  100%  98%  ?Weight:      ?  ? ?There is no height or weight on file to calculate BMI. ? ?Physical Exam  ? ?Physical Exam ?Gen: A&O x4, NAD ?HEENT: Atraumatic, normocephalic;mucous membranes moist; oropharynx clear, tongue without atrophy or fasciculations. L>R superior cervical lymphadenopathy. No vesicles in ear canals. ?Neck: Supple, trachea midline. ?Resp: CTAB, no w/r/r ?CV: RRR, no m/g/r; nml S1 and S2. 2+ symmetric peripheral pulses. ?Abd: soft/NT/ND; nabs x 4 quad ?Extrem: Nml bulk; no cyanosis, clubbing, or edema. ? ?Neuro: ?*MS: A&O x4. Follows multi-step commands.  ?*Speech: fluid, nondysarthric, able to name and repeat ?*CN:  ?  I: Deferred ?  II,III: PERRLA, VFF by confrontation, optic discs unable to be visualized 2/2 pupillary constriction ?  III,IV,VI: EOMI w/o nystagmus, no ptosis ?  V: Sensation intact from V1 to V3 to LT ?  VII: Eyelid closure was weak but complete.  Upper and lower facial weakness on L, unable to wrinkle forehead ?  VIII: Hearing intact to voice ?  IX,X: Voice normal, palate elevates symmetrically  ?  XI: SCM/trap 5/5 bilat   ?XII: Tongue protrudes midline, no atrophy or fasciculations  ? ?*Motor:   Normal bulk.  No tremor, rigidity or bradykinesia. No pronator drift. ? ?  Strength: Dlt Bic Tri WrE WrF FgS Gr HF KnF KnE PlF DoF  ?  Left 5 5 5 5 5 5 5 5 5 5 5 5   ?  Right 5 5 5 5 5 5 5 5 5 5 5 5   ? ? ?*Sensory: Intact to light touch, pinprick, temperature vibration throughout. Symmetric. Propioception intact bilat.  No  double-simultaneous extinction.  ?*Coordination:  Finger-to-nose, heel-to-shin, rapid alternating motions were intact. ?*Reflexes:  2+ and symmetric throughout without clonus; toes down-going bilat ?*Gait: deferred ? ? ?Labs  ? ?C

## 2021-11-04 NOTE — Discharge Summary (Signed)
Physician Discharge Summary  ?[place] [id number] DOB: 06/29/1951 ? ?PCP: Ethelda ChickSmith, Kristi M, MD ? ?Admitted from: Home ?Discharged to: Home ? ?Admit date: 11/03/2021 ?Discharge date: 11/04/2021 ? ?Recommendations for Outpatient Follow-up:  ? ? Follow-up Information   ? ? Ethelda ChickSmith, Kristi M, MD. Schedule an appointment as soon as possible for a visit in 1 week(s).   ?Specialty: Family Medicine ?Why: To be seen with repeat labs (CBC & BMP).  Kindly monitor and manage diabetes control. ?Contact information: ?[address] ?[place] [address] ?[phone] ? ? ?  ?  ? ? Lonell FaceShah, Hemang K, MD. Schedule an appointment as soon as possible for a visit in 4 week(s).   ?Specialty: Neurology ?Contact information: ?[address] ?[facility] ?[place] [address] ?[phone] ? ? ?  ?  ? ?  ?  ? ?  ? ? ? ?Home Health: None ?  ? ?Equipment/Devices: None ?  ? ?Discharge Condition: Improved and stable. ?  Code Status: Prior ?Diet recommendation:  ?Discharge Diet Orders (From admission, onward)  ? ?  Start     Ordered  ? 11/04/21 0000  Diet - low sodium heart healthy       ? 11/04/21 1124  ? 11/04/21 0000  Diet Carb Modified       ? 11/04/21 1124  ? ?  ?  ? ?  ?  ? ?Discharge Diagnoses:  ?Principal Problem: ?  Weakness on left side of face ?Active Problems: ?  Type II diabetes mellitus (HCC) ?  Atrial fibrillation, chronic (HCC) ?  Asthma, chronic ?  OSA on CPAP ?  Hypothyroidism ?  Hypertension ?  Multiple lung nodules on CT ? ? ?Brief Summary: ?71 year old married female, medical history significant for asthma, atrial fibrillation on Eliquis, OSA on CPAP, hypertension, type II DM and hypothyroidism, presented to the emergency room with left facial weakness.  On 3/13, in the morning she had some routine lab work done.  In the afternoon she took a nap like she usually does.  Somewhere between 1-3 PM, she noted some soreness under her left ear/around the left angle of the mandible and had to  readjust her CPAP strap.  Her son and daughter-in-law came to visit them and around 5:45 PM, they noticed that she had left-sided facial weakness.  She denied headache, earache, tinnitus, any other strokelike symptoms.  She admitted for further evaluation and management. ? ?Assessment and plan: ? ?1.  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  MRI brain without acute infarct.  CTA head and neck without large vessel occlusion.  Neurology was consulted.  No further stroke work-up indicated.  Otoscopic exam unremarkable and no vesicles noted in the ear to suggest Ramsay Hunt.  Neurology recommended prednisone 60 Mg daily x7 days with no taper and valacyclovir 1 g 3 times daily x7 days and outpatient follow-up with neurology in 4 to 6 weeks.  Patient received first dose of these medications in the hospital.  Although left eye closure is weak, able to completely close the eye.  Counseled patient and spouse that if she is unable to completely close the eye during sleep then taped the left eyelids during sleep to avoid dryness of left eye and related complications. ?2.  Atrial fibrillation: A-fib with controlled ventricular rate.  Continue Eliquis. ?3.  Asthma: Stable without clinical bronchospasm.  Continue prior home meds ?4.  Essential hypertension: No indication for permissive hypertension.  Continue prior home meds. ?  5.  Type II DM: Counseled patient to monitor CBGs 3 times daily before meals and at bedtime, document them.  It is possible that her glycemic control may get worse with steroids and she can follow-up with her PCP and consider increasing metformin dose or an additional medication.  They verbalized understanding.  A1c well controlled: 6 in August 2022. ?6.  Multiple lung nodules: These were noted incidentally on CTA neck.  Admitting MD discussed with patient and her husband who reported that she has had multiple scans over the last 10 years to evaluate lung nodules,  most recently a PET scan last year, after which she was told they were benign.  Outpatient follow-up. ? ?Consultations: ?Neurology ? ?Procedures: ?None ? ? ?Discharge Instructions ? ?Discharge Instructions   ? ? Ambulatory referral to Neurology   Complete by: As directed ?  ? Call MD for:   Complete by: As directed ?  ? Worsening left facial weakness.  ? Diet - low sodium heart healthy   Complete by: As directed ?  ? Diet Carb Modified   Complete by: As directed ?  ? Discharge instructions   Complete by: As directed ?  ? If unable to close the left eye fully during sleep, patient has been counseled to use some regular tape to close the eyelids.  She verbalized understanding.  ? Increase activity slowly   Complete by: As directed ?  ? ?  ? ?  ?Medication List  ?  ? ?TAKE these medications   ? ?Advair HFA 45-21 MCG/ACT inhaler ?Generic drug: fluticasone-salmeterol ?Inhale 2 puffs into the lungs every 12 (twelve) hours. ?  ?albuterol 108 (90 Base) MCG/ACT inhaler ?Commonly known as: VENTOLIN HFA ?Inhale 2 puffs into the lungs every 6 (six) hours as needed for wheezing or shortness of breath. ?  ?ALPRAZolam 0.5 MG tablet ?Commonly known as: Prudy Feeler ?Take 0.5 mg by mouth daily as needed for anxiety. ?  ?Cholecalciferol 50 MCG (2000 UT) Caps ?Take 2,000 Units by mouth in the morning. ?  ?cyanocobalamin 1000 MCG tablet ?Take 1,000 mcg by mouth in the morning. ?  ?Eliquis 5 MG Tabs tablet ?Generic drug: apixaban ?Take 5 mg by mouth 2 (two) times daily. ?  ?metFORMIN 500 MG 24 hr tablet ?Commonly known as: GLUCOPHAGE-XR ?Take 500 mg by mouth 2 (two) times daily. ?  ?mirtazapine 30 MG tablet ?Commonly known as: REMERON ?Take 30 mg by mouth at bedtime. ?  ?mometasone 50 MCG/ACT nasal spray ?Commonly known as: NASONEX ?Place 1 spray into the nose 2 (two) times daily. ?  ?montelukast 10 MG tablet ?Commonly known as: SINGULAIR ?Take 10 mg by mouth at bedtime. ?  ?olmesartan 40 MG tablet ?Commonly known as: BENICAR ?Take 40 mg by  mouth daily. ?  ?pravastatin 20 MG tablet ?Commonly known as: PRAVACHOL ?Take 20 mg by mouth at bedtime. ?  ?predniSONE 20 MG tablet ?Commonly known as: DELTASONE ?Take 3 tablets (60 mg total) by mouth daily with breakfast for 6 days. ?Start taking on: November 05, 2021 ?  ?scopolamine 1 MG/3DAYS ?Commonly known as: TRANSDERM-SCOP ?Place 1 patch onto the skin every 3 (three) days. ?  ?spironolactone 100 MG tablet ?Commonly known as: ALDACTONE ?Take 100 mg by mouth daily. ?  ?Synthroid 75 MCG tablet ?Generic drug: levothyroxine ?Take 75 mcg by mouth daily. ?  ?valACYclovir 1000 MG tablet ?Commonly known as: VALTREX ?Take 1 tablet (1,000 mg total) by mouth 3 (three) times daily for 7 days. ?  ? ?  ? ?  Allergies  ?Allergen Reactions  ? Hydrochlorothiazide Other (See Comments)  ?  Hyponatremia  ? ? ? ? ?Procedures/Studies: ?CT ANGIO HEAD NECK W WO CM ? ?Result Date: 11/04/2021 ?CLINICAL DATA:  AFib, left facial droop EXAM: CT ANGIOGRAPHY HEAD AND NECK TECHNIQUE: Multidetector CT imaging of the head and neck was performed using the standard protocol during bolus administration of intravenous contrast. Multiplanar CT image reconstructions and MIPs were obtained to evaluate the vascular anatomy. Carotid stenosis measurements (when applicable) are obtained utilizing NASCET criteria, using the distal internal carotid diameter as the denominator. RADIATION DOSE REDUCTION: This exam was performed according to the departmental dose-optimization program which includes automated exposure control, adjustment of the mA and/or kV according to patient size and/or use of iterative reconstruction technique. CONTRAST:  74mL OMNIPAQUE IOHEXOL 350 MG/ML SOLN COMPARISON:  CT head 11/03/2021. no prior CTA FINDINGS: CT HEAD FINDINGS For noncontrast findings, please see same day CT head. CTA NECK FINDINGS Aortic arch: Standard branching. Imaged portion shows no evidence of aneurysm or dissection. No significant stenosis of the major arch vessel  origins. Right carotid system: No evidence of dissection, stenosis (50% or greater) or occlusion. Left carotid system: No evidence of dissection, stenosis (50% or greater) or occlusion. Vertebral arteries: Dimin

## 2021-11-04 NOTE — Discharge Instructions (Signed)

## 2021-11-05 LAB — HEMOGLOBIN A1C
Hgb A1c MFr Bld: 6.4 % — ABNORMAL HIGH (ref 4.8–5.6)
Mean Plasma Glucose: 137 mg/dL

## 2022-12-25 ENCOUNTER — Other Ambulatory Visit: Payer: Self-pay | Admitting: Family Medicine

## 2022-12-25 DIAGNOSIS — Z1231 Encounter for screening mammogram for malignant neoplasm of breast: Secondary | ICD-10-CM

## 2022-12-25 DIAGNOSIS — E2839 Other primary ovarian failure: Secondary | ICD-10-CM

## 2023-07-02 IMAGING — MR MR HEAD W/O CM
14 series · 48 of 48 positions shown · non-contrast
Comparison: No prior MRI, correlation is made with CT and CTA
11/03/2021

CLINICAL DATA: Sudden onset left facial droop

EXAM:
MRI HEAD WITHOUT CONTRAST
TECHNIQUE: Multiplanar, multiecho pulse sequences of the brain and surrounding
structures were obtained without intravenous contrast.

[Series 5: ax dwi_tracew · axial · 3.0mm · 0.65mm/px · z∈[-126,+28]mm · 2 of 48 slices shown]
[im 1/48]
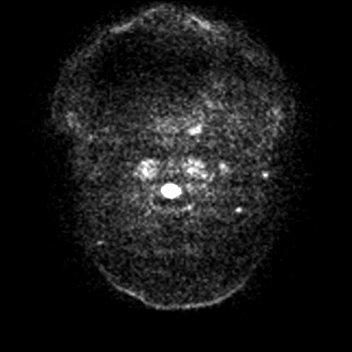
[im 48/48]
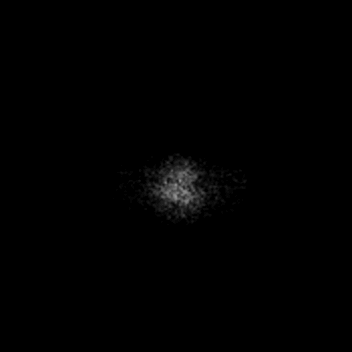

[Series 6: ax dwi_adc · axial · 3.0mm · 0.65mm/px · z∈[-126,+28]mm · 3 of 48 slices shown]
[im 1/48]
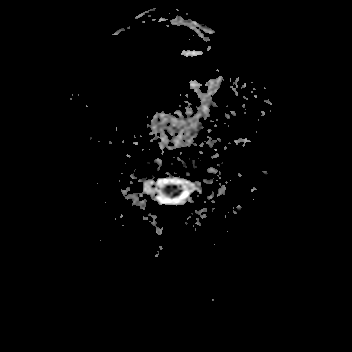
[im 24/48]
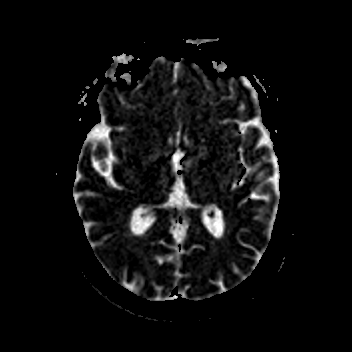
[im 48/48]
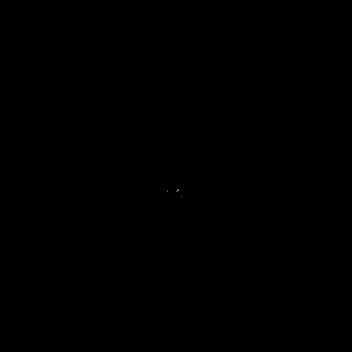

[Series 7: cor dwi_tracew · coronal · 5.0mm · 0.65mm/px · 3 of 40 slices shown]
[im 1/40]
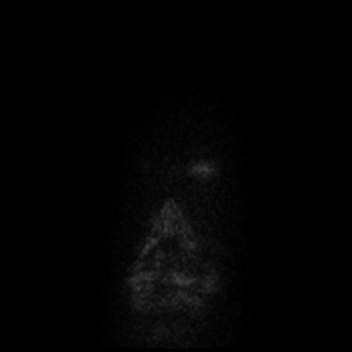
[im 20/40]
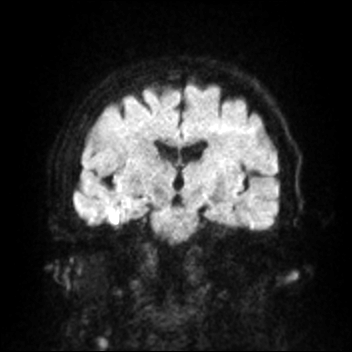
[im 40/40]
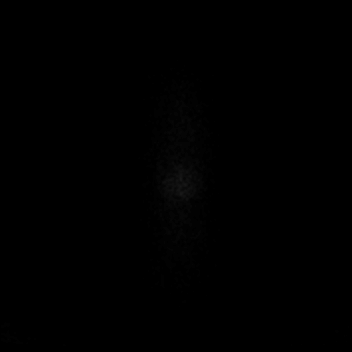

[Series 8: cor dwi_adc · coronal · 5.0mm · 0.65mm/px · 2 of 37 slices shown]
[im 1/37]
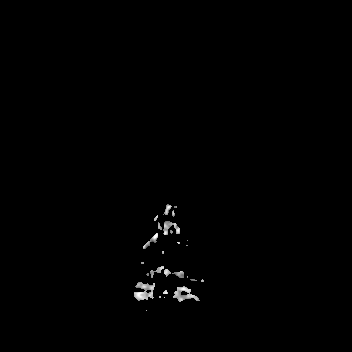
[im 37/37]
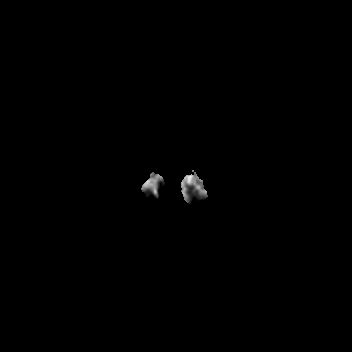

[Series 9: T1 · sagittal · 5.0mm · 0.62mm/px · 2 of 23 slices shown (1 of 3)]
[im 1/23]
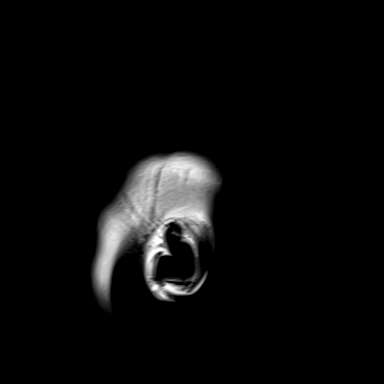
[im 23/23]
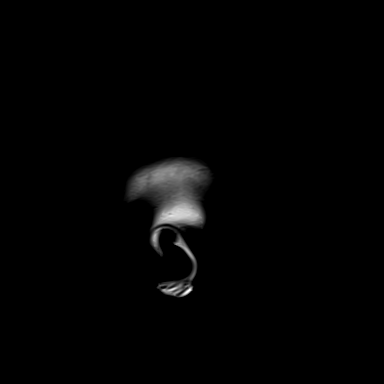

[Series 10: T2 · axial · 5.0mm · 0.53mm/px · z∈[-120,+23]mm · 2 of 25 slices shown (1 of 2)]
[im 1/25]
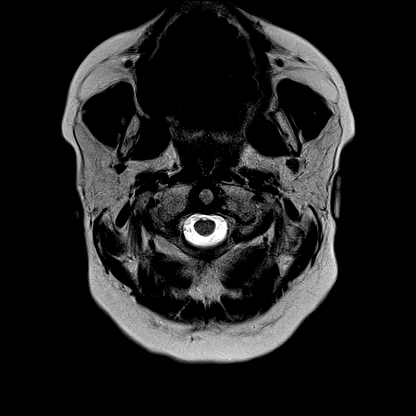
[im 25/25]
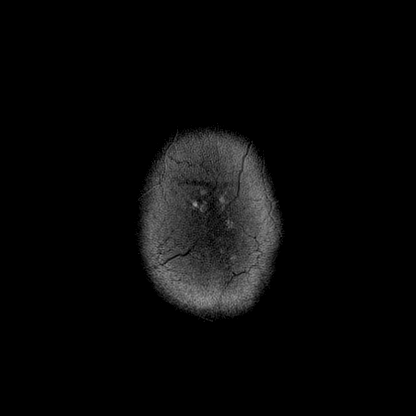

[Series 11: mag_images · axial · 3.0mm · 0.90mm/px · z∈[-136,+39]mm · 4 of 60 slices shown]
[im 1/60]
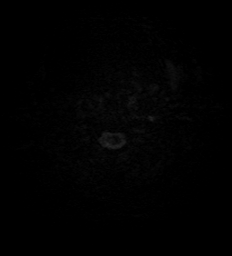
[im 20/60]
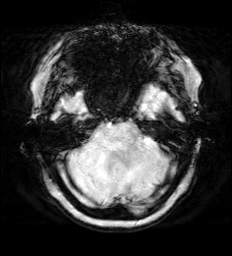
[im 40/60]
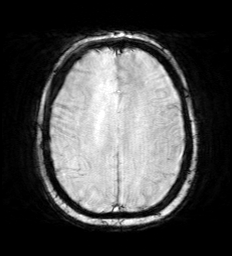
[im 60/60]
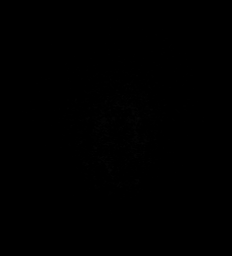

[Series 12: pha_images · axial · 3.0mm · 0.90mm/px · z∈[-136,+33]mm · 4 of 58 slices shown]
[im 1/58]
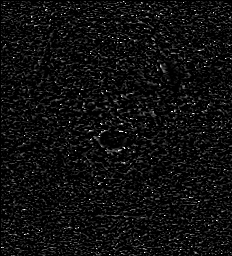
[im 20/58]
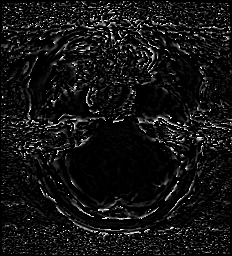
[im 39/58]
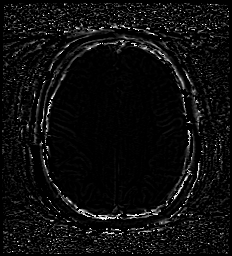
[im 58/58]
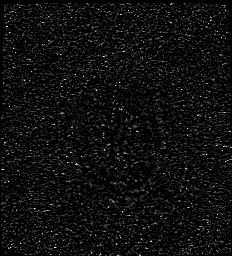

[Series 13: swi_images · axial · 3.0mm · 0.90mm/px · z∈[-136,+39]mm · 4 of 60 slices shown]
[im 1/60]
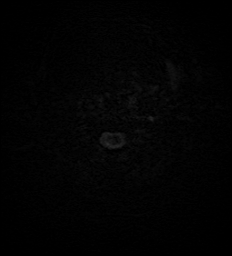
[im 20/60]
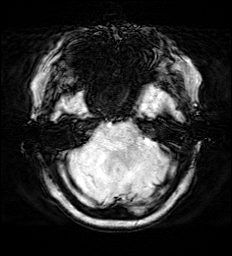
[im 40/60]
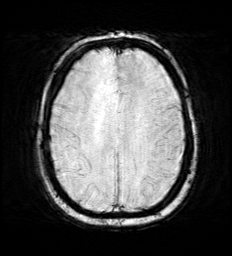
[im 60/60]
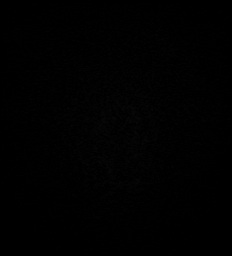

[Series 14: mip_images(sw) · axial · 24.0mm · 0.90mm/px · z∈[-126,+29]mm · 3 of 53 slices shown]
[im 1/53]
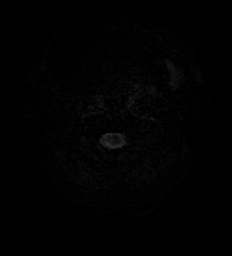
[im 27/53]
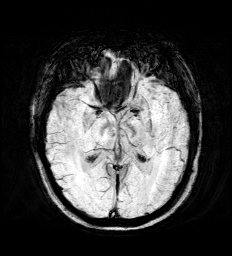
[im 53/53]
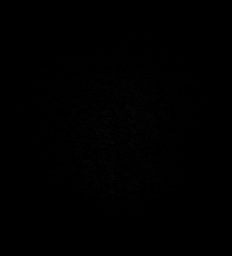

[Series 15: T1 · sagittal · 5.0mm · 0.94mm/px · 2 of 23 slices shown (2 of 3)]
[im 1/23]
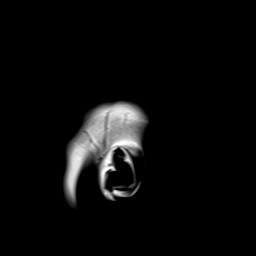
[im 23/23]
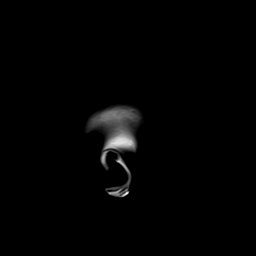

[Series 16: FLAIR · axial · 3.0mm · 0.53mm/px · z∈[-128,+32]mm · 4 of 55 slices shown]
[im 1/55]
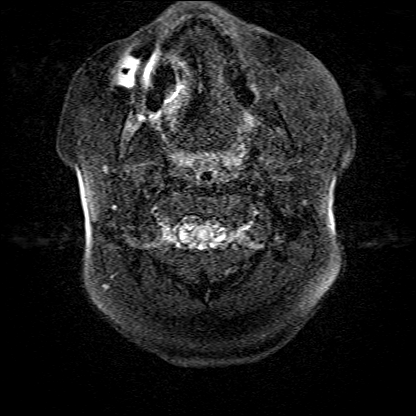
[im 19/55]
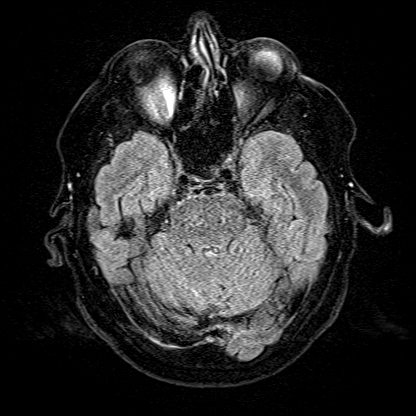
[im 37/55]
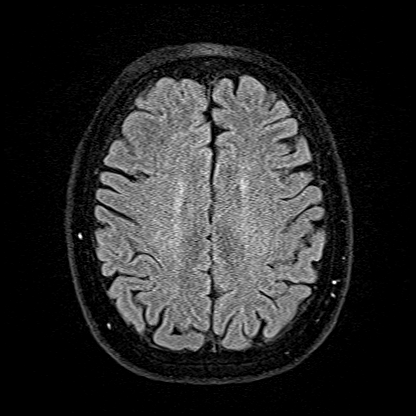
[im 55/55]
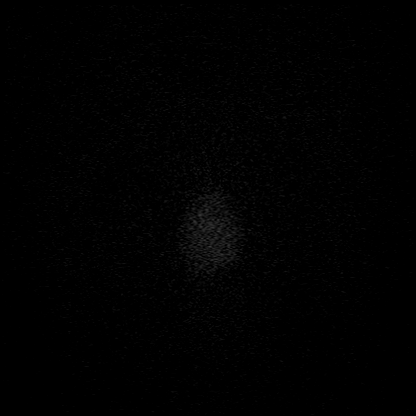

[Series 17: T1 · axial · 1.0mm · 0.98mm/px · z∈[-137,+37]mm · 11 of 176 slices shown (3 of 3)]
[im 1/176]
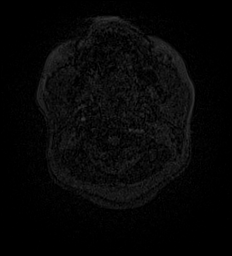
[im 18/176]
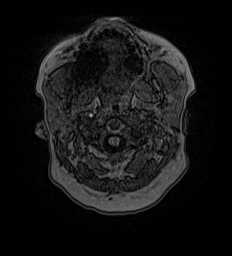
[im 36/176]
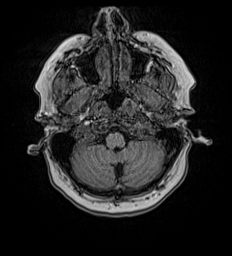
[im 53/176]
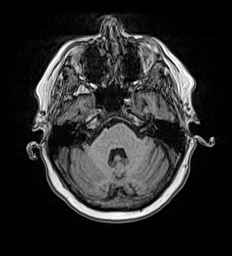
[im 71/176]
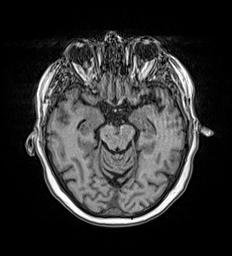
[im 88/176]
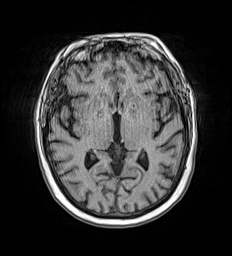
[im 106/176]
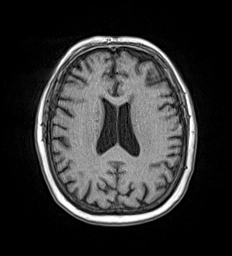
[im 123/176]
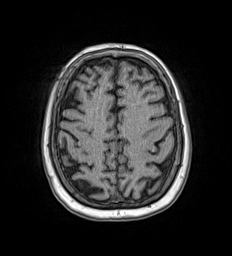
[im 141/176]
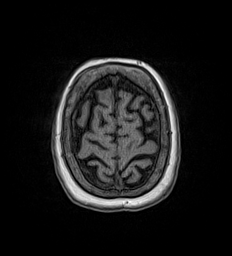
[im 158/176]
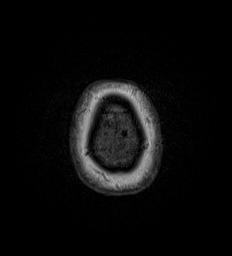
[im 176/176]
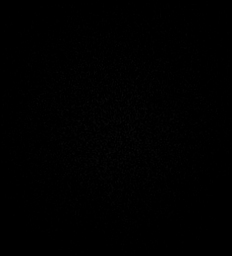

[Series 18: T2 · coronal · 5.0mm · 0.57mm/px · 2 of 29 slices shown (2 of 2)]
[im 1/29]
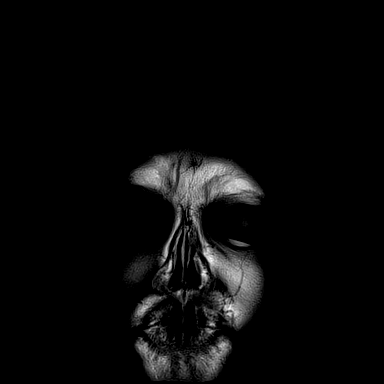
[im 29/29]
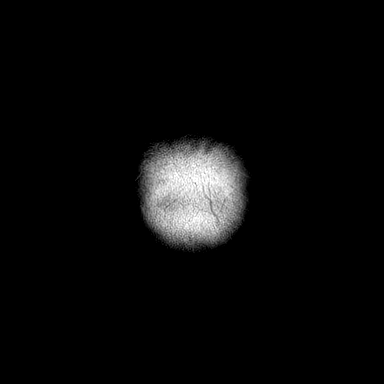

[48 of 48 positions shown; findings below may reference images not displayed]

FINDINGS: Brain: No restricted diffusion to suggest acute or subacute infarct.
No acute hemorrhage, mass, mass effect, or midline shift. No
hydrocephalus or extra-axial collection. Scattered T2 hyperintense
signal in the periventricular white matter, likely the sequela of
mild chronic small vessel ischemic disease.

Vascular: Normal flow voids.

Skull and upper cervical spine: Normal marrow signal.

Sinuses/Orbits: Mucosal thickening in the anterior ethmoid air
cells. The orbits are unremarkable.

Other: The mastoids are well aerated.
IMPRESSION: No acute intracranial process.
